# Patient Record
Sex: Male | Born: 1950 | Race: Black or African American | Hispanic: No | Marital: Married | Smoking: Former smoker
Health system: Southern US, Community
[De-identification: ages and names within clinical notes are randomized; demographics above are authoritative.]

---

## 2016-08-23 ENCOUNTER — Other Ambulatory Visit: Payer: Self-pay | Admitting: Internal Medicine

## 2016-08-23 DIAGNOSIS — R1031 Right lower quadrant pain: Secondary | ICD-10-CM

## 2016-08-24 ENCOUNTER — Other Ambulatory Visit: Payer: Self-pay | Admitting: Internal Medicine

## 2016-08-24 DIAGNOSIS — R1031 Right lower quadrant pain: Secondary | ICD-10-CM

## 2016-09-05 ENCOUNTER — Ambulatory Visit (HOSPITAL_COMMUNITY)
Admission: RE | Admit: 2016-09-05 | Discharge: 2016-09-05 | Disposition: A | Discharge status: Home / Self Care | Payer: Medicare Other | Source: Ambulatory Visit | Attending: Internal Medicine | Admitting: Internal Medicine

## 2016-09-05 DIAGNOSIS — R1031 Right lower quadrant pain: Secondary | ICD-10-CM | POA: Insufficient documentation

## 2021-03-03 ENCOUNTER — Other Ambulatory Visit: Payer: Self-pay | Admitting: *Deleted

## 2021-03-03 DIAGNOSIS — Z87891 Personal history of nicotine dependence: Secondary | ICD-10-CM

## 2021-03-27 NOTE — Progress Notes (Signed)
Virtual Visit via Telephone Note  I connected with Matthew Livingston on 03/27/21 at  9:30 AM EST by telephone and verified that I am speaking with the correct person using two identifiers.  Location: Patient: Home Provider: Office    I discussed the limitations, risks, security and privacy concerns of performing an evaluation and management service by telephone and the availability of in person appointments. I also discussed with the patient that there may be a patient responsible charge related to this service. The patient expressed understanding and agreed to proceed.  Shared Decision Making Visit Lung Cancer Screening Program [phone])   Eligibility: Age 70 y.o. Pack Years Smoking History Calculation 65 (# packs/per year x # years smoked) Recent History of coughing up blood  no Unexplained weight loss? no ( >Than 15 pounds within the last 6 months ) Prior History Lung / other cancer no (Diagnosis within the last 5 years already requiring surveillance chest CT Scans). Smoking Status Former Smoker Former Smokers: Years since quit: 5 years  Quit Date: 2017  Visit Components: Discussion included one or more decision making aids. yes Discussion included risk/benefits of screening. yes Discussion included potential follow up diagnostic testing for abnormal scans. yes Discussion included meaning and risk of over diagnosis. yes Discussion included meaning and risk of False Positives. yes Discussion included meaning of total radiation exposure. yes  Counseling Included: Importance of adherence to annual lung cancer LDCT screening. yes Impact of comorbidities on ability to participate in the program. yes Ability and willingness to under diagnostic treatment. yes  Smoking Cessation Counseling: Current Smokers:  Discussed importance of smoking cessation. yes Information about tobacco cessation classes and interventions provided to patient. yes Patient provided with "ticket" for LDCT  Scan. NA-televisit Symptomatic Patient. no  Counseling(Intermediate counseling: > three minutes) 99406 Diagnosis Code: Tobacco Use Z72.0 Asymptomatic Patient yes  Counseling (Intermediate counseling: > three minutes counseling) [id number] Former Smokers:  Discussed the importance of maintaining cigarette abstinence. yes Diagnosis Code: Personal History of Nicotine Dependence. D74.128 Information about tobacco cessation classes and interventions provided to patient. Yes Patient provided with "ticket" for LDCT Scan. yes Written Order for Lung Cancer Screening with LDCT placed in Epic. Yes (CT Chest Lung Cancer Screening Low Dose W/O CM) [id number] Z12.2-Screening of respiratory organs Z87.891-Personal history of nicotine dependence   I have spent 25 minutes of face to face time with Matthew Livingston discussing the risks and benefits of lung cancer screening. We viewed a power point together that explained in detail the above noted topics. We paused at intervals to allow for questions to be asked and answered to ensure understanding.We discussed that the single most powerful action that his can take to decrease  risk of developing lung cancer is to quit smoking. We discussed whether or not he is ready to commit to setting a quit date. We discussed options for tools to aid in quitting smoking including nicotine replacement therapy, non-nicotine medications, support groups, Quit Smart classes, and behavior modification. We discussed that often times setting smaller, more achievable goals, such as eliminating 1 cigarette a day for a week and then 2 cigarettes a day for a week can be helpful in slowly decreasing the number of cigarettes smoked. This allows for a sense of accomplishment as well as providing a clinical benefit. I gave him the " Be Stronger Than Your Excuses" card with contact information for community resources, classes, free nicotine replacement therapy, and access to mobile apps, text messaging, and  on-line smoking cessation help.  I have also given him my card and contact information in the event he needs to contact me. We discussed the time and location of the scan, and that either Abigail Miyamoto RN or I will call with the results within 24-48 hours of receiving them. I have offered him  a copy of the power point we viewed  as a resource in the event they need reinforcement of the concepts we discussed today in the office. The patient verbalized understanding of all of  the above and had no further questions upon leaving the office. They have my contact information in the event they have any further questions.  I spent 3-5 minutes counseling on smoking cessation and the health risks of continued tobacco abuse.  I explained to the patient that there has been a high incidence of coronary artery disease noted on these exams. I explained that this is a non-gated exam therefore degree or severity cannot be determined. This patient is on statin therapy. I have asked the patient to follow-up with their PCP regarding any incidental finding of coronary artery disease and management with diet or medication as their PCP  feels is clinically indicated. The patient verbalized understanding of the above and had no further questions upon completion of the visit.     Glenford Bayley, NP

## 2021-03-27 NOTE — Patient Instructions (Signed)
Thank you for participating in the [facility]. °It was our pleasure to meet you today. °We will call you with the results of your scan within the next few days. °Your scan will be assigned a Lung RADS category score by the physicians reading the scans.  °This Lung RADS score determines follow up scanning.  °See below for description of categories, and follow up screening recommendations. °We will be in touch to schedule your follow up screening annually or based on recommendations of our providers. °We will fax a copy of your scan results to your Primary Care Physician, or the physician who referred you to the program, to ensure they have the results. °Please call the office if you have any questions or concerns regarding your scanning experience or results.  °Our office number is [phone]. °Please speak with Denise Phelps, RN. She is our Lung Cancer Screening RN. °If she is unavailable when you call, please have the office staff send her a message. She will return your call at her earliest convenience. °Remember, if your scan is normal, we will scan you annually as long as you continue to meet the criteria for the program. (Age 55-77, Current smoker or smoker who has quit within the last 15 years). °If you are a smoker, remember, quitting is the single most powerful action that you can take to decrease your risk of lung cancer and other pulmonary, breathing related problems. °We know quitting is hard, and we are here to help.  °Please let us know if there is anything we can do to help you meet your goal of quitting. °If you are a former smoker, congratulations. We are proud of you! Remain smoke free! °Remember you can refer friends or family members through the number above.  °We will screen them to make sure they meet criteria for the program. °Thank you for helping us take better care of you by participating in Lung Screening. ° °You can receive free nicotine replacement therapy  ( patches, gum or mints) by calling 1-800-QUIT NOW. Please call so we can get you on the path to becoming  a non-smoker. I know it is hard, but you can do this! ° °Lung RADS Categories: ° °Lung RADS 1: no nodules or definitely non-concerning nodules.  °Recommendation is for a repeat annual scan in 12 months. ° °Lung RADS 2:  nodules that are non-concerning in appearance and behavior with a very low likelihood of becoming an active cancer. °Recommendation is for a repeat annual scan in 12 months. ° °Lung RADS 3: nodules that are probably non-concerning , includes nodules with a low likelihood of becoming an active cancer.  Recommendation is for a 6-month repeat screening scan. Often noted after an upper respiratory illness. We will be in touch to make sure you have no questions, and to schedule your 6-month scan. ° °Lung RADS 4 A: nodules with concerning findings, recommendation is most often for a follow up scan in 3 months or additional testing based on our provider's assessment of the scan. We will be in touch to make sure you have no questions and to schedule the recommended 3 month follow up scan. ° °Lung RADS 4 B:  indicates findings that are concerning. We will be in touch with you to schedule additional diagnostic testing based on our provider's  assessment of the scan. ° °Hypnosis for smoking cessation  °[employer] °[phone] ° °Acupuncture for smoking cessation  °[facility] °[phone]  °

## 2021-03-28 ENCOUNTER — Encounter: Payer: Self-pay | Admitting: Primary Care

## 2021-03-28 ENCOUNTER — Ambulatory Visit (INDEPENDENT_AMBULATORY_CARE_PROVIDER_SITE_OTHER): Payer: Medicare Other | Admitting: Primary Care

## 2021-03-28 ENCOUNTER — Other Ambulatory Visit: Payer: Self-pay

## 2021-03-28 DIAGNOSIS — Z87891 Personal history of nicotine dependence: Secondary | ICD-10-CM

## 2021-03-29 ENCOUNTER — Other Ambulatory Visit: Payer: Self-pay

## 2021-03-29 ENCOUNTER — Ambulatory Visit
Admission: RE | Admit: 2021-03-29 | Discharge: 2021-03-29 | Disposition: A | Discharge status: Home / Self Care | Payer: Medicare Other | Source: Ambulatory Visit | Attending: Acute Care | Admitting: Acute Care

## 2021-03-29 DIAGNOSIS — Z87891 Personal history of nicotine dependence: Secondary | ICD-10-CM | POA: Diagnosis not present

## 2021-05-15 ENCOUNTER — Other Ambulatory Visit: Payer: Self-pay

## 2021-05-15 DIAGNOSIS — Z87891 Personal history of nicotine dependence: Secondary | ICD-10-CM

## 2021-05-15 NOTE — Progress Notes (Signed)
c 

## 2022-04-02 ENCOUNTER — Ambulatory Visit: Payer: Medicare Other

## 2022-04-23 ENCOUNTER — Ambulatory Visit
Admission: RE | Admit: 2022-04-23 | Discharge: 2022-04-23 | Disposition: A | Discharge status: Home / Self Care | Payer: Medicare Other | Source: Ambulatory Visit | Attending: Acute Care | Admitting: Acute Care

## 2022-04-23 DIAGNOSIS — Z87891 Personal history of nicotine dependence: Secondary | ICD-10-CM | POA: Diagnosis present

## 2022-04-25 ENCOUNTER — Other Ambulatory Visit: Payer: Self-pay | Admitting: Acute Care

## 2022-04-25 DIAGNOSIS — Z122 Encounter for screening for malignant neoplasm of respiratory organs: Secondary | ICD-10-CM

## 2022-04-25 DIAGNOSIS — Z87891 Personal history of nicotine dependence: Secondary | ICD-10-CM

## 2022-11-19 ENCOUNTER — Other Ambulatory Visit: Payer: Self-pay | Admitting: Internal Medicine

## 2022-11-19 DIAGNOSIS — R9431 Abnormal electrocardiogram [ECG] [EKG]: Secondary | ICD-10-CM

## 2022-11-23 ENCOUNTER — Ambulatory Visit
Admission: RE | Admit: 2022-11-23 | Discharge: 2022-11-23 | Disposition: A | Discharge status: Home / Self Care | Payer: Medicare Other | Source: Ambulatory Visit | Attending: Internal Medicine | Admitting: Internal Medicine

## 2022-11-23 DIAGNOSIS — R9431 Abnormal electrocardiogram [ECG] [EKG]: Secondary | ICD-10-CM | POA: Insufficient documentation

## 2023-04-01 IMAGING — CT CT CHEST LUNG CANCER SCREENING LOW DOSE W/O CM
2 of 5 series · 15 of 40 positions shown, 18 images · non-contrast
Comparison: None.

CLINICAL DATA: Sixty-five pack-year smoking history, quitting 8
years ago.

EXAM:
CT CHEST WITHOUT CONTRAST LOW-DOSE FOR LUNG CANCER SCREENING
TECHNIQUE: Multidetector CT imaging of the chest was performed following the
standard protocol without IV contrast.

[Series 3: lung 1.00 · axial · 0.65mm/px · z∈[-1205,-914]mm · 12 of 321 slices shown, 15 images]
[im 15/321  mediastinal]
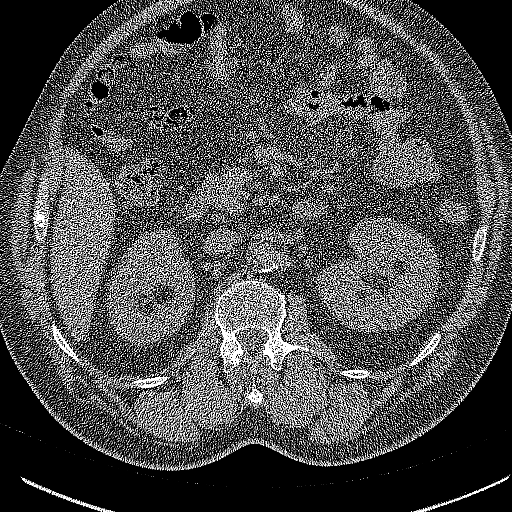
[im 15/321  lung]
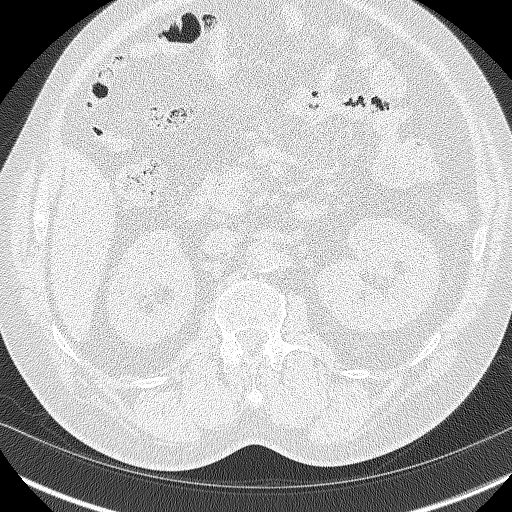
[im 44/321  lung]
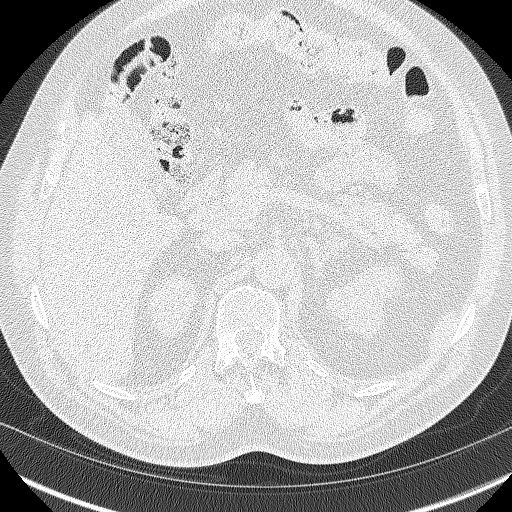
[im 73/321  lung]
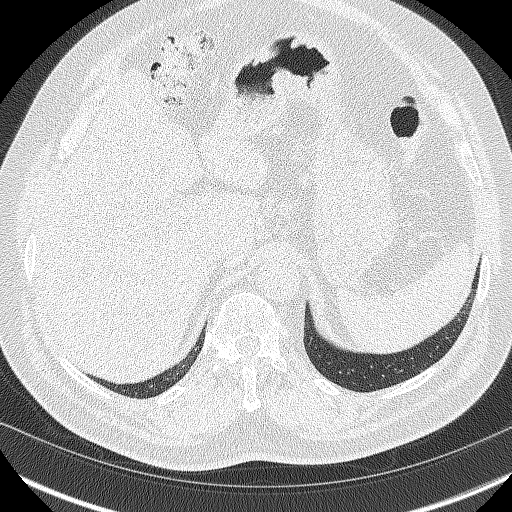
[im 102/321  lung]
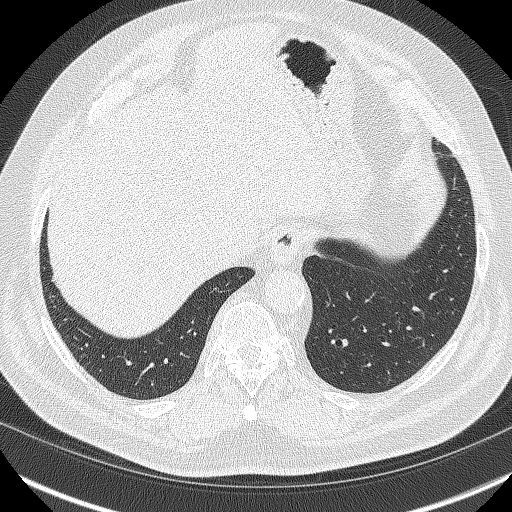
[im 117/321  mediastinal]
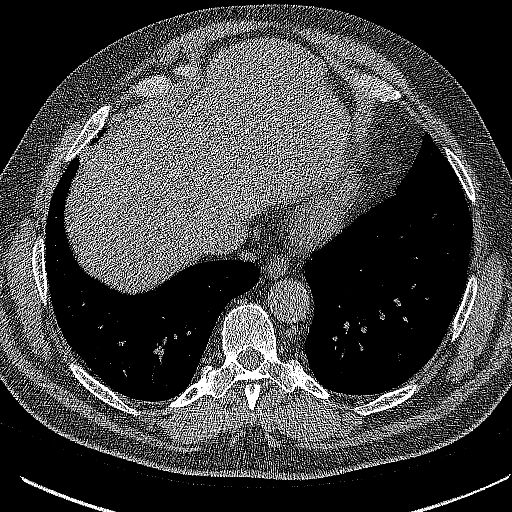
[im 117/321  lung]
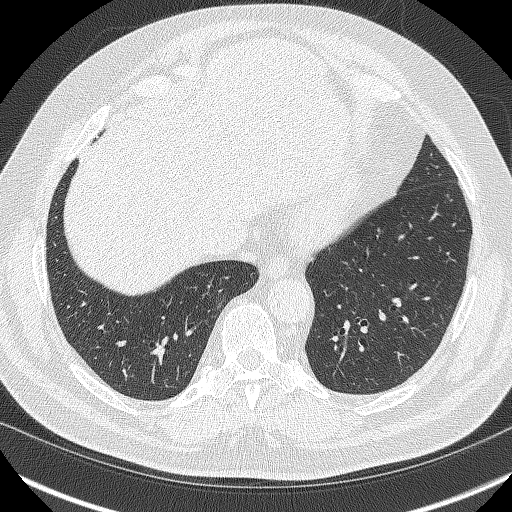
[im 146/321  lung]
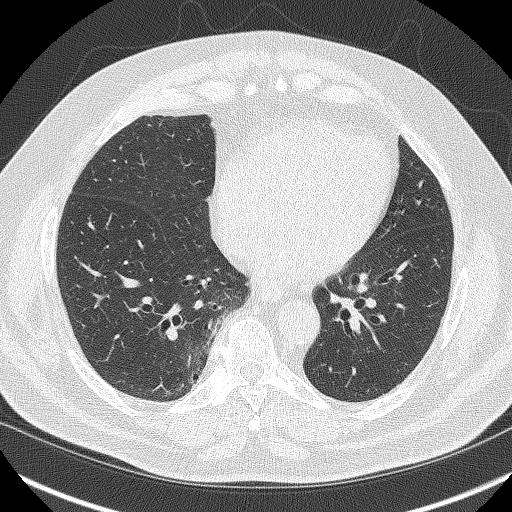
[im 175/321  lung]
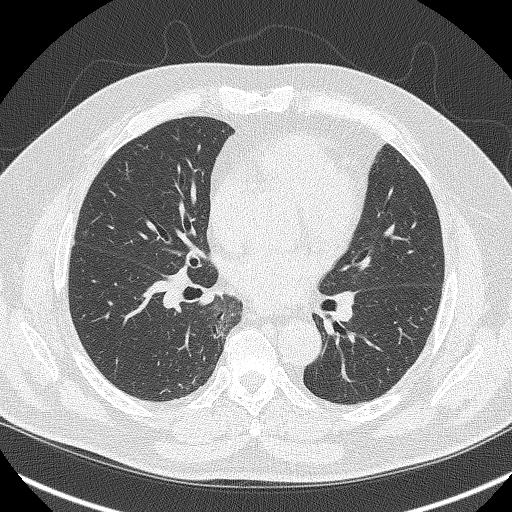
[im 204/321  lung]
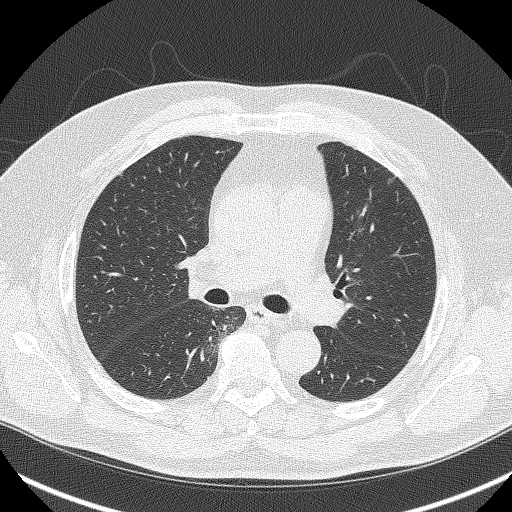
[im 219/321  mediastinal]
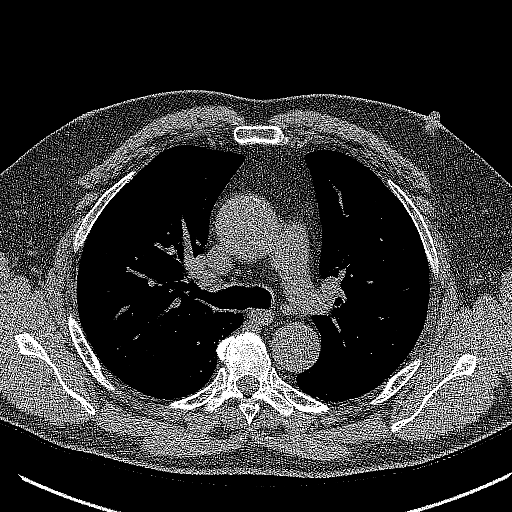
[im 219/321  lung]
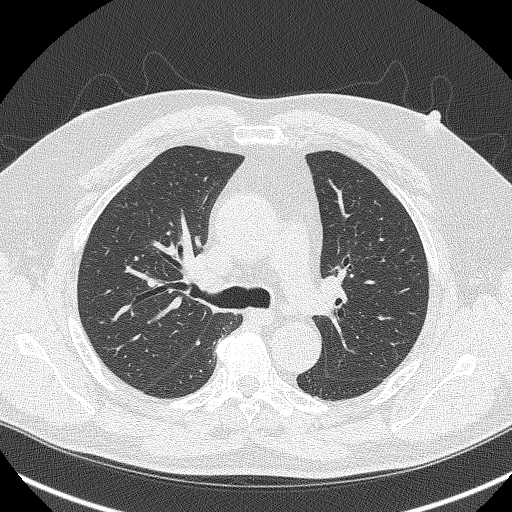
[im 248/321  lung]
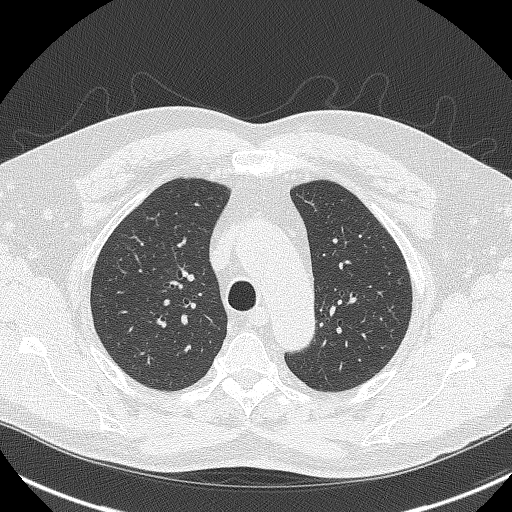
[im 277/321  lung]
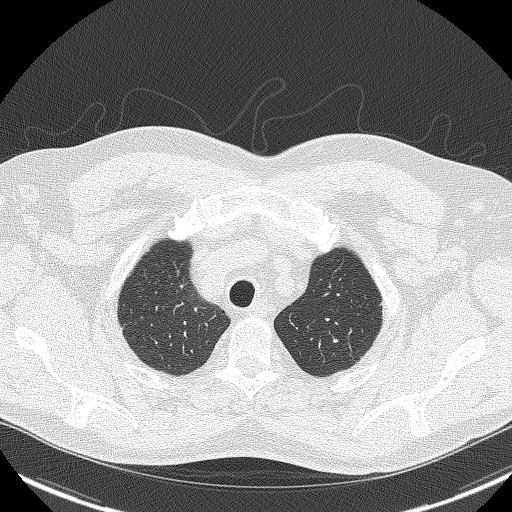
[im 306/321  lung]
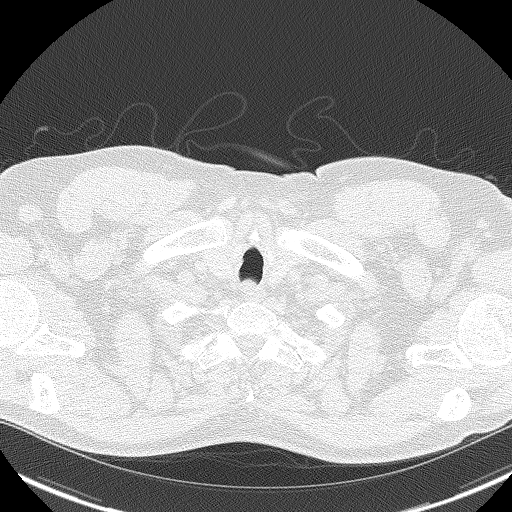

[Series 5: coronals lung 1.00 cor · coronal · 0.63mm/px · 3 of 301 slices shown]
[im 61/301  lung]
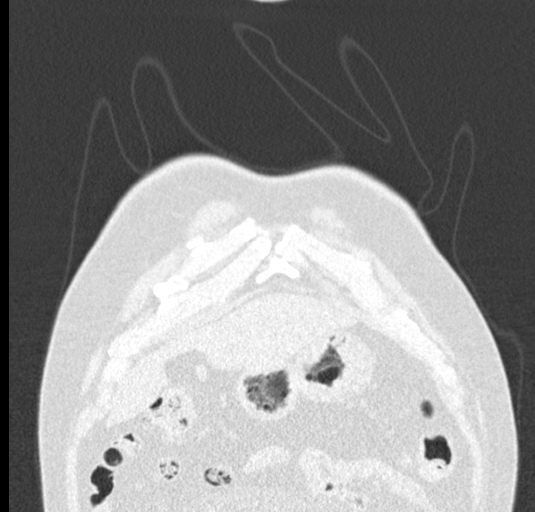
[im 121/301  lung]
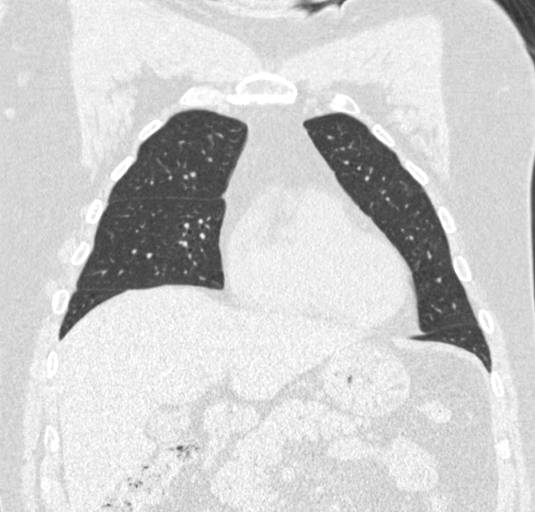
[im 181/301  lung]
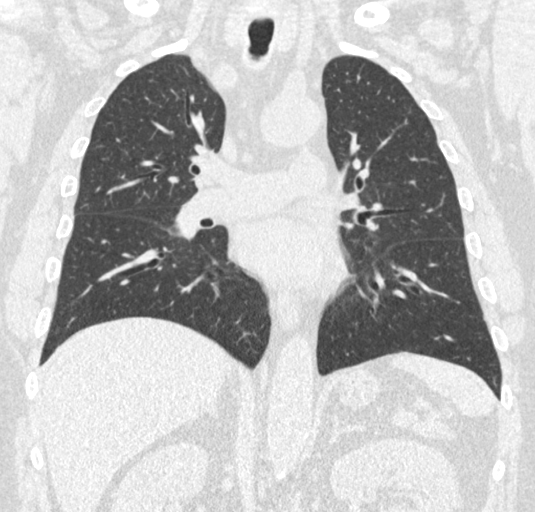

[15 of 40 positions shown; findings below may reference images not displayed]

FINDINGS: Cardiovascular: Upper normal ascending aortic caliber, including at
3.9 cm on [DATE] and coronal image 152. Aortic atherosclerosis.
Tortuous thoracic aorta. Borderline cardiomegaly.

Mediastinum/Nodes: Multiple small bilateral axillary nodes. These
measure maximally 11 mm in the left axilla on [DATE].

No mediastinal or definite hilar adenopathy, given limitations of
unenhanced CT.

Lungs/Pleura: No pleural fluid. Mild centrilobular emphysema. Peri
fissural pulmonary nodules of maximally volume derived equivalent
diameter 3.1 mm.

Upper Abdomen: Normal imaged portions of the liver, spleen, stomach,
pancreas, gallbladder, adrenal glands, right kidney. Probable
parenchymal dystrophic calcification in the interpolar left kidney
of 2-3 mm. A too small to characterize interpolar left renal lesion
is suspected at 7 mm on 64/2.

Mild small jejunal mesenteric nodes with subtle increased density in
the jejunal mesenteric fat. Incompletely imaged on 64/2.

Musculoskeletal: No acute osseous abnormality.
IMPRESSION: 1. Lung-RADS 2, benign appearance or behavior. Continue annual
screening with low-dose chest CT without contrast in 12 months.
2. Upper normal ascending aortic caliber, including at 3.9 cm.
Recommend attention on follow-up.
3. Mild left axillary adenopathy with multiple small bilateral
axillary and jejunal mesenteric nodes. These are likely reactive.
Recommend attention on follow-up.
4. Aortic Atherosclerosis ([id number]) and Emphysema ([id number].7).

## 2023-04-25 ENCOUNTER — Ambulatory Visit: Payer: Medicare Other

## 2023-10-14 ENCOUNTER — Other Ambulatory Visit: Payer: Self-pay | Admitting: Emergency Medicine

## 2023-10-14 DIAGNOSIS — Z87891 Personal history of nicotine dependence: Secondary | ICD-10-CM

## 2023-10-14 DIAGNOSIS — Z122 Encounter for screening for malignant neoplasm of respiratory organs: Secondary | ICD-10-CM

## 2023-10-25 ENCOUNTER — Ambulatory Visit

## 2023-11-15 ENCOUNTER — Ambulatory Visit
Admission: RE | Admit: 2023-11-15 | Discharge: 2023-11-15 | Disposition: A | Discharge status: Home / Self Care | Source: Ambulatory Visit | Attending: Emergency Medicine | Admitting: Emergency Medicine

## 2023-11-15 DIAGNOSIS — Z87891 Personal history of nicotine dependence: Secondary | ICD-10-CM | POA: Insufficient documentation

## 2023-11-15 DIAGNOSIS — Z122 Encounter for screening for malignant neoplasm of respiratory organs: Secondary | ICD-10-CM | POA: Insufficient documentation

## 2024-08-10 ENCOUNTER — Ambulatory Visit: Admitting: Urology
